# Patient Record
Sex: Male | Born: 2005
Health system: Southern US, Community
[De-identification: ages and names within clinical notes are randomized; demographics above are authoritative.]

## PROBLEM LIST (undated history)

## (undated) DIAGNOSIS — L309 Dermatitis, unspecified: Secondary | ICD-10-CM

## (undated) DIAGNOSIS — J302 Other seasonal allergic rhinitis: Secondary | ICD-10-CM

## (undated) DIAGNOSIS — T148XXA Other injury of unspecified body region, initial encounter: Secondary | ICD-10-CM

## (undated) DIAGNOSIS — G43909 Migraine, unspecified, not intractable, without status migrainosus: Secondary | ICD-10-CM

---
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
## 2023-08-08 ENCOUNTER — Emergency Department (HOSPITAL_COMMUNITY)
Admission: EM | Admit: 2023-08-08 | Discharge: 2023-08-08 | Disposition: A | Discharge status: Home / Self Care | Payer: 59 | Attending: Emergency Medicine | Admitting: Emergency Medicine

## 2023-08-08 ENCOUNTER — Other Ambulatory Visit: Payer: Self-pay

## 2023-08-08 ENCOUNTER — Ambulatory Visit: Payer: Self-pay | Admitting: Pediatrics

## 2023-08-08 ENCOUNTER — Encounter (HOSPITAL_COMMUNITY): Payer: Self-pay

## 2023-08-08 DIAGNOSIS — J069 Acute upper respiratory infection, unspecified: Secondary | ICD-10-CM | POA: Insufficient documentation

## 2023-08-08 DIAGNOSIS — R197 Diarrhea, unspecified: Secondary | ICD-10-CM | POA: Insufficient documentation

## 2023-08-08 DIAGNOSIS — R519 Headache, unspecified: Secondary | ICD-10-CM | POA: Diagnosis not present

## 2023-08-08 DIAGNOSIS — R509 Fever, unspecified: Secondary | ICD-10-CM | POA: Diagnosis present

## 2023-08-08 NOTE — Discharge Instructions (Addendum)
 You were seen today for upper respiratory infection.  This is most likely due to a virus which is not treated with antibiotics.  If you continue to have fever after the next 3 to 4 days, follow-up with PCP.  If fever is not helped with Tylenol , you begin to experience chest pain, you begin experience shortness of breath, or symptoms worsen, return to ER for further evaluation.  Continues take Tylenol  every 6 hours.  Take Tylenol  (acetominophen)  650mg  every 4-6 hours, as needed for pain or fever. Do not take more than 4,000 mg in a 24-hour period. As this may cause liver damage. While this is rare, if you begin to develop yellowing of the skin or eyes, stop taking and return to ER immediately.  It was a pleasure seeing you in the ER.

## 2023-08-08 NOTE — ED Provider Triage Note (Signed)
 Emergency Medicine Provider Triage Evaluation Note  Jeremy Osborne , a 18 y.o. male  was evaluated in triage.  Pt complains of 2 days of cough, congestion, malaise, fever Tmax 104.80F . Denies sick contacts. Tolerating food and liquid.   Review of Systems  Positive: Headaches, Diarrhea x2 episodes, shortness of breath.  Negative: Neck stiffness, Abdominal pain, chest pain, n/v, sore throat.   Physical Exam  BP (!) 116/57 (BP Location: Right Arm)   Pulse 93   Temp 99 F (37.2 C) (Oral)   Resp 14   Ht 6' (1.829 m)   Wt 72.6 kg   SpO2 96%   BMI 21.70 kg/m  Gen:   Awake, no distress   Resp:  Normal effort  MSK:   Moves extremities without difficulty  Other:    Medical Decision Making  Medically screening exam initiated at 1:09 PM.  Appropriate orders placed.  Jeremy Osborne was informed that the remainder of the evaluation will be completed by another provider, this initial triage assessment does not replace that evaluation, and the importance of remaining in the ED until their evaluation is complete.     Jeremy Osborne, [place] 08/08/23 1316

## 2023-08-08 NOTE — Telephone Encounter (Signed)
 Chief Complaint: Fever Symptoms: Fever, diarrhea, body aches, congestion, stuffy nose Frequency: constant for two days Pertinent Negatives: Patient denies vomiting Disposition: [x] ED /[] Urgent Care (no appt availability in office) / [] Appointment(In office/virtual)/ []  [facility]/ [] Home Care/ [] Refused Recommended Disposition /[] [facility]/ []  Follow-up with PCP Additional Notes: Patient and mother called in stating the patient is running a 103.3 fever and has had a high fever since yesterday. Patient is also experiencing body aches, diarrhea, body aches, and feeling very bad. Mom states they have been treating fever with acetaminophen . After acetaminophen , new reading is 102.0. Advised patient to be seen in ER and evaluated, patient does not have a PCP. Patient stated they will call back after ER visit to schedule new PCP appointment.    Copied from CRM [id number]. Topic: Clinical - Red Word Triage >> Aug 08, 2023 10:12 AM Zebedee SAUNDERS wrote: Red Word that prompted transfer to Nurse Triage: Pt running fever of 103.3 for two days, headache, weakness, congestion, coughing. Reason for Disposition  Patient sounds very sick or weak to the triager  (Exception: Mild weakness and hasn't taken fever medicine.)  Answer Assessment - Initial Assessment Questions 1. TEMPERATURE: What is the most recent temperature?  How was it measured?      103.3 - 20 minutes ago - digital in armpit. Asked mother to check again while on the phone and it is  2. ONSET: When did the fever start?      Yesterday  3. CHILLS: Do you have chills? If yes: How bad are they?  (e.g., none, mild, moderate, severe)   - NONE: no chills   - MILD: feeling cold   - MODERATE: feeling very cold, some shivering (feels better under a thick blanket)   - SEVERE: feeling extremely cold with shaking chills (general body shaking, rigors; even under a thick blanket)      No - feeling hot 4. OTHER SYMPTOMS: Do you  have any other symptoms besides the fever?  (e.g., abdomen pain, cough, diarrhea, earache, headache, sore throat, urination pain)     Body aches, runny nose, cough, congestion, diarrhea, headache 5. CAUSE: If there are no symptoms, ask: What do you think is causing the fever?      Unsure 6. CONTACTS: Does anyone else in the family have an infection?     No 7. TREATMENT: What have you done so far to treat this fever? (e.g., medications)     Been giving acetaminophen  (last dose 20 minutes ago)  8. IMMUNOCOMPROMISE: Do you have of the following: diabetes, HIV positive, splenectomy, cancer chemotherapy, chronic steroid treatment, transplant patient, etc.     No  Protocols used: Fever-A-AH

## 2023-08-08 NOTE — ED Provider Notes (Signed)
 [place] [facility] AT [facility] Provider Note   CSN: [id number] Arrival date & time: 08/08/23  1049     History  Chief Complaint  Patient presents with   Fever    Jeremy Osborne is a 18 y.o. male.   Fever Associated symptoms: congestion, cough and diarrhea   Patient presents to the ED today complaining of a 2-day history of cough, congestion, fever.  Previous medical history of AKI.  Also endorses mild intermittent headache, and 2 episodes of diarrhea.  He is able to tolerate food and liquids.  Fever has since abated without medication today.  Patient states Tmax was 104F as of yesterday but was helped with Tylenol .  Patient denies any sick contacts, neck stiffness, shortness of breath, abdominal pain, chest pain, nausea, vomiting, sore throat.     Home Medications Prior to Admission medications   Medication Sig Start Date End Date Taking? Authorizing Provider  albuterol  (PROVENTIL  HFA;VENTOLIN  HFA) 108 (90 BASE) MCG/ACT inhaler Inhale 2 puffs into the lungs every 6 (six) hours as needed for wheezing or shortness of breath. For wheezing    [provider]  levofloxacin  (LEVAQUIN ) 500 MG tablet Take 1 tablet (500 mg total) by mouth daily. 11/23/21   Spurling, Asberry CROME, NP      Allergies    Augmentin [amoxicillin-pot clavulanate]    Review of Systems   Review of Systems  Constitutional:  Positive for fever.  HENT:  Positive for congestion.   Respiratory:  Positive for cough.   Gastrointestinal:  Positive for diarrhea.    Physical Exam Updated Vital Signs BP (!) 116/57 (BP Location: Right Arm)   Pulse 93   Temp 99 F (37.2 C) (Oral)   Resp 14   Ht 6' (1.829 m)   Wt 72.6 kg   SpO2 96%   BMI 21.70 kg/m  Physical Exam Vitals and nursing note reviewed.  Constitutional:      Appearance: Normal appearance.  HENT:     Head: Normocephalic and atraumatic.     Nose: Congestion present.     Mouth/Throat:     Mouth: Mucous membranes  are moist.     Pharynx: Oropharynx is clear. No oropharyngeal exudate or posterior oropharyngeal erythema.  Eyes:     General: No scleral icterus.       Right eye: No discharge.        Left eye: No discharge.     Extraocular Movements: Extraocular movements intact.     Conjunctiva/sclera: Conjunctivae normal.  Neck:     Vascular: No carotid bruit.  Cardiovascular:     Rate and Rhythm: Normal rate and regular rhythm.     Pulses: Normal pulses.     Heart sounds: Normal heart sounds. No murmur heard.    No friction rub. No gallop.  Pulmonary:     Effort: Pulmonary effort is normal. No respiratory distress.     Breath sounds: Normal breath sounds.  Abdominal:     General: Abdomen is flat.     Palpations: Abdomen is soft.     Tenderness: There is no abdominal tenderness.  Musculoskeletal:     Cervical back: No rigidity or tenderness.     Right lower leg: No edema.     Left lower leg: No edema.  Lymphadenopathy:     Cervical: No cervical adenopathy.  Skin:    General: Skin is warm and dry.     Coloration: Skin is not jaundiced or pale.     Findings:  No bruising or erythema.  Neurological:     General: No focal deficit present.     Mental Status: He is alert. Mental status is at baseline.  Psychiatric:        Mood and Affect: Mood normal.     ED Results / Procedures / Treatments   Labs (all labs ordered are listed, but only abnormal results are displayed) Labs Reviewed - No data to display  EKG None  Radiology No results found.  Procedures Procedures    Medications Ordered in ED Medications - No data to display  ED Course/ Medical Decision Making/ A&P                                 Medical Decision Making  This patient is a 18 year old male who presents to the ED for concern of cough, congestion, fever.   Differential diagnoses prior to evaluation: URI, pneumonia, foodborne illness.  Past Medical History / Social History / Additional history: Chart  reviewed. Pertinent results include: Has not visited the ER since 11/23/2021 for a preseptal cellulitis.  Has had a previous medical history of AKI.  Medications / Treatment: No medications or treatment needed at this time.  ED course:  Patient is a 18 year old male presents the ED with a 2-day history of fever, congestion, fever, chills and 2 episodes of diarrhea.  Previous medical history of an AKI.  Today he is currently experiencing chills, malaise, cough, congestion.  However is not experiencing any neck symptoms, current fever, shortness of breath, chest pain, abdominal pain.  He is also able to tolerate foods and liquids.  Patient has not taken Tylenol  today.  Fever yesterday was helped by Tylenol .  This most likely appears to be a URI.  Low concern for pneumonia as patient is LCTAB and able to ambulate appropriately without shortness of breath.  No other concerning signs or symptoms at this current moment.  Denies sore throat or worsening symptoms.  For this reason I believe the patient patient can be treated outpatient for URI.  For this reason, explained to patient that respiratory panel would not change treatment plan for this patient.  Also explained that chest x-ray could rule out pneumonia but at this time was not necessarily indicated due to no shortness of breath or wheezes, rales, rhonchi heard in lungs bilaterally thus would be low indication for suspicion for pneumonia.  Patient agreed and opted out of getting further workup.  Strict return to ER precautions were given.  Patient was explained symptomatic treatment and what to be aware of for reasons to return to the ER.  Patient expressed understanding and agreement with plan.  Patient's vitals were stable throughout time of stay.  Appears stable to be discharged this time.     Disposition: After consideration of the diagnostic results and the patients response to treatment, I feel that the patient benefit from discharge and  treatment noted as above.   emergency department workup does not suggest an emergent condition requiring admission or immediate intervention beyond what has been performed at this time. The plan is: Symptomatic treatment, return for new or worsening symptoms, go to PCP to be reevaluated if symptoms persist for more than the next 2 days.. The patient is safe for discharge and has been instructed to return immediately for worsening symptoms, change in symptoms or any other concerns.    Final Clinical Impression(s) / ED Diagnoses Final diagnoses:  Viral  upper respiratory tract infection    Rx / DC Orders ED Discharge Orders     None         Beola Terrall GORMAN DEVONNA 08/08/23 1354    Cleotilde Rogue, MD 08/09/23 [phone]

## 2023-08-08 NOTE — ED Triage Notes (Signed)
 Pt. Developed a fever yesterday highest was. 104.5.  Pt. Taking tylenol last dose was this am.  Pt. Is afebrile at this time.   Pt. Has a cough, congestion, headaches, chills, fever and body aches/  Denies any n/v does have diarrhea.
